# Patient Record
Sex: Male | Born: 1985 | Race: Black or African American | Hispanic: No | Marital: Single | Smoking: Current every day smoker
Health system: Southern US, Community
[De-identification: ages and names within clinical notes are randomized; demographics above are authoritative.]

## PROBLEM LIST (undated history)

## (undated) HISTORY — PX: TESTICLE SURGERY: SHX794

## (undated) HISTORY — PX: OTHER SURGICAL HISTORY: SHX169

---

## 2018-09-30 ENCOUNTER — Emergency Department (HOSPITAL_COMMUNITY): Payer: Self-pay

## 2018-09-30 ENCOUNTER — Emergency Department (HOSPITAL_COMMUNITY)
Admission: EM | Admit: 2018-09-30 | Discharge: 2018-10-01 | Disposition: A | Discharge status: Home / Self Care | Payer: Self-pay | Attending: Emergency Medicine | Admitting: Emergency Medicine

## 2018-09-30 ENCOUNTER — Encounter (HOSPITAL_COMMUNITY): Payer: Self-pay | Admitting: Emergency Medicine

## 2018-09-30 ENCOUNTER — Other Ambulatory Visit: Payer: Self-pay

## 2018-09-30 DIAGNOSIS — Y92838 Other recreation area as the place of occurrence of the external cause: Secondary | ICD-10-CM | POA: Insufficient documentation

## 2018-09-30 DIAGNOSIS — T148XXA Other injury of unspecified body region, initial encounter: Secondary | ICD-10-CM

## 2018-09-30 DIAGNOSIS — F17210 Nicotine dependence, cigarettes, uncomplicated: Secondary | ICD-10-CM | POA: Insufficient documentation

## 2018-09-30 DIAGNOSIS — Y9352 Activity, horseback riding: Secondary | ICD-10-CM | POA: Insufficient documentation

## 2018-09-30 DIAGNOSIS — T1490XA Injury, unspecified, initial encounter: Secondary | ICD-10-CM

## 2018-09-30 DIAGNOSIS — Y998 Other external cause status: Secondary | ICD-10-CM | POA: Insufficient documentation

## 2018-09-30 DIAGNOSIS — S8991XA Unspecified injury of right lower leg, initial encounter: Secondary | ICD-10-CM | POA: Insufficient documentation

## 2018-09-30 MED ORDER — OXYCODONE-ACETAMINOPHEN 5-325 MG PO TABS
1.0000 | ORAL_TABLET | Freq: Once | ORAL | Status: AC
  Administered 2018-09-30: 1 via ORAL
  Filled 2018-09-30: qty 1

## 2018-09-30 MED ORDER — KETOROLAC TROMETHAMINE 60 MG/2ML IM SOLN
30.0000 mg | Freq: Once | INTRAMUSCULAR | Status: AC
  Administered 2018-09-30: 30 mg via INTRAMUSCULAR
  Filled 2018-09-30: qty 2

## 2018-09-30 NOTE — ED Triage Notes (Signed)
Pt c/o right leg pain after getting dragged by horse. Pt has scraps on face and neck. Pt has some swelling to right knee. Pt ambulatory to triage.

## 2018-10-01 NOTE — ED Provider Notes (Signed)
[facility] EMERGENCY DEPARTMENT Provider Note   CSN: [id number] Arrival date & time: 09/30/18  2115    History   Chief Complaint Chief Complaint  Patient presents with  . Leg Injury    HPI Sean Zuniga is a 33 y.o. male.      Knee Pain Location:  Knee Injury: yes   Mechanism of injury comment:  Was riding a horse when he fell off twisting his right knee because his foot was stuck in the stirrup and hearing a pop Knee location:  R knee Pain details:    Quality:  Aching and sharp   Radiates to:  Does not radiate   Severity:  Moderate   Onset quality:  Sudden   Timing:  Constant Chronicity:  New Dislocation: no   Prior injury to area:  No Relieved by:  None tried Worsened by:  Nothing Ineffective treatments:  None tried   History reviewed. No pertinent past medical history.  There are no active problems to display for this patient.   Past Surgical History:  Procedure Laterality Date  . finger reattachment    . TESTICLE SURGERY          Home Medications    Prior to Admission medications   Not on File    Family History No family history on file.  Social History Social History   Tobacco Use  . Smoking status: Current Every Day Smoker    Packs/day: 1.50  . Smokeless tobacco: Former Network engineer Use Topics  . Alcohol use: Never    Frequency: Never  . Drug use: Never     Allergies   Patient has no allergy information on record.   Review of Systems Review of Systems  Skin: Positive for wound (below his right eye and in middle lower back).  All other systems reviewed and are negative.    Physical Exam Updated Vital Signs BP 118/82   Pulse 86   Temp 98.5 F (36.9 C)   Resp 16   Ht 6' (1.829 m)   Wt 73.9 kg   SpO2 100%   BMI 22.11 kg/m   Physical Exam Vitals signs and nursing note reviewed.  Constitutional:      Appearance: He is well-developed.  HENT:     Head: Normocephalic and atraumatic.     Mouth/Throat:   Mouth: Mucous membranes are dry.     Pharynx: Oropharynx is clear.  Eyes:     Conjunctiva/sclera: Conjunctivae normal.  Neck:     Musculoskeletal: Normal range of motion.  Cardiovascular:     Rate and Rhythm: Normal rate.  Pulmonary:     Effort: Pulmonary effort is normal. No respiratory distress.  Abdominal:     General: There is no distension.  Musculoskeletal: Normal range of motion.        General: Swelling (mild rightk nee) and tenderness (with palpation of knee and with ROM. would not allow full drawer and valgus/varus stress testing 2/2 pain) present.  Skin:    General: Skin is warm and dry.     Comments: Abrasion under right eye and on left lower back near spine without laceration  Neurological:     General: No focal deficit present.     Mental Status: He is alert.      ED Treatments / Results  Labs (all labs ordered are listed, but only abnormal results are displayed) Labs Reviewed - No data to display  EKG None  Radiology Dg Tibia/fibula Right  Result Date: 09/30/2018  CLINICAL DATA:  Right lower leg pain following being dragged by horse, initial encounter EXAM: RIGHT TIBIA AND FIBULA - 2 VIEW COMPARISON:  None. FINDINGS: There is no evidence of fracture or other focal bone lesions. Soft tissues are unremarkable. IMPRESSION: No acute abnormality noted. Electronically Signed   By: Alcide CleverMark  Lukens M.D.   On: 09/30/2018 22:47   Dg Knee Complete 4 Views Right  Result Date: 09/30/2018 CLINICAL DATA:  Right knee pain following being drug by horse, initial encounter EXAM: RIGHT KNEE - COMPLETE 4+ VIEW COMPARISON:  None. FINDINGS: No evidence of fracture, dislocation, or joint effusion. No evidence of arthropathy or other focal bone abnormality. Soft tissues are unremarkable. IMPRESSION: No acute abnormality noted. Electronically Signed   By: Alcide CleverMark  Lukens M.D.   On: 09/30/2018 22:48   Dg Femur, Min 2 Views Right  Result Date: 09/30/2018 CLINICAL DATA:  Right hip pain following  being drug by horse, initial encounter EXAM: RIGHT FEMUR 2 VIEWS COMPARISON:  None. FINDINGS: No acute fracture or dislocation is noted. Benign fibrous cortical defect is noted within the distal femur. No soft tissue abnormality is noted. IMPRESSION: No acute abnormality seen. Electronically Signed   By: Alcide CleverMark  Lukens M.D.   On: 09/30/2018 22:48    Procedures Procedures (including critical care time)  Medications Ordered in ED Medications  oxyCODONE-acetaminophen (PERCOCET/ROXICET) 5-325 MG per tablet 1 tablet (1 tablet Oral Given 09/30/18 2348)  ketorolac (TORADOL) injection 30 mg (30 mg Intramuscular Given 09/30/18 2348)     Initial Impression / Assessment and Plan / ED Course  I have reviewed the triage vital signs and the nursing notes.  Pertinent labs & imaging results that were available during my care of the patient were reviewed by me and considered in my medical decision making (see chart for details).        Likely ligamentous injury of his right knee.  Knee immobilizer given.  Instructed to follow with orthopedics near his home.  If amatory is in the meantime.  Final Clinical Impressions(s) / ED Diagnoses   Final diagnoses:  Injury of right knee, initial encounter  Ligament tear    ED Discharge Orders    None       Jadarian Mckay, Barbara CowerJason, MD 10/01/18 [phone]

## 2020-04-26 IMAGING — DX RIGHT TIBIA AND FIBULA - 2 VIEW
4 series · 4 of 4 positions shown · non-contrast
Comparison: None.

CLINICAL DATA: Right lower leg pain following being dragged by
horse, initial encounter

EXAM:
RIGHT TIBIA AND FIBULA - 2 VIEW

[tibia ap (1 of 2)]
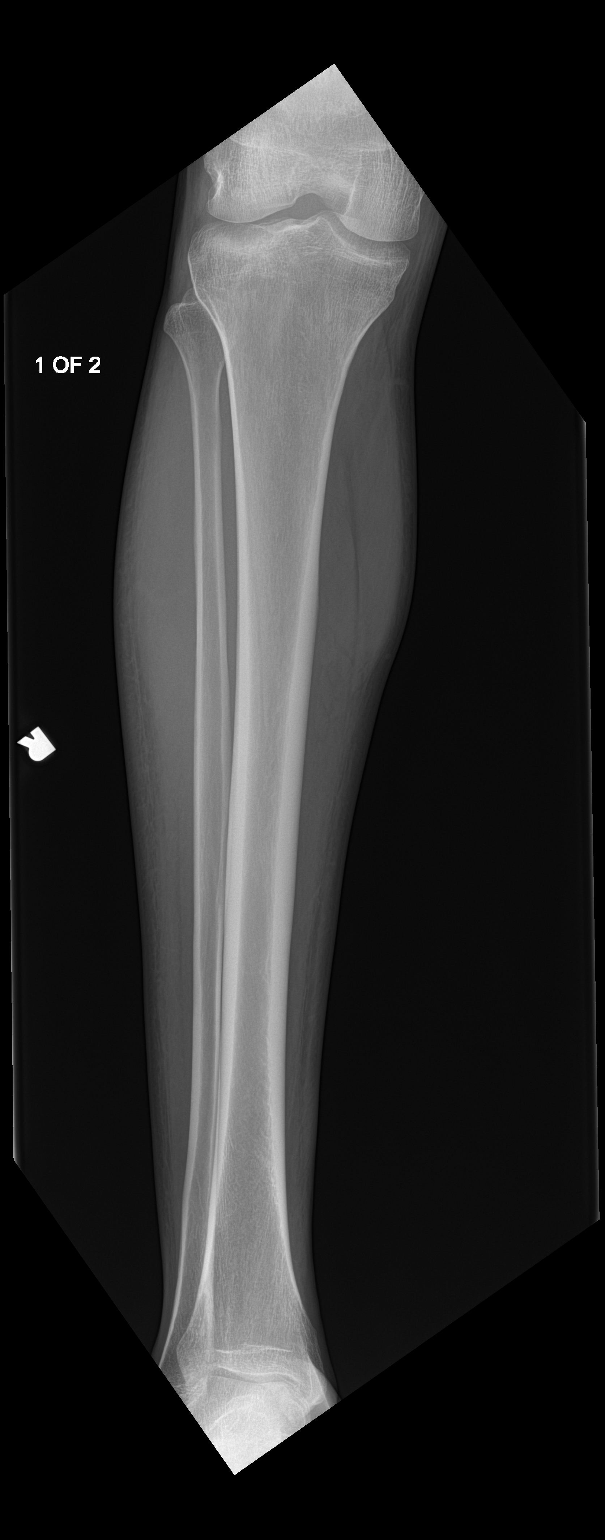

[tibia ap (2 of 2)]
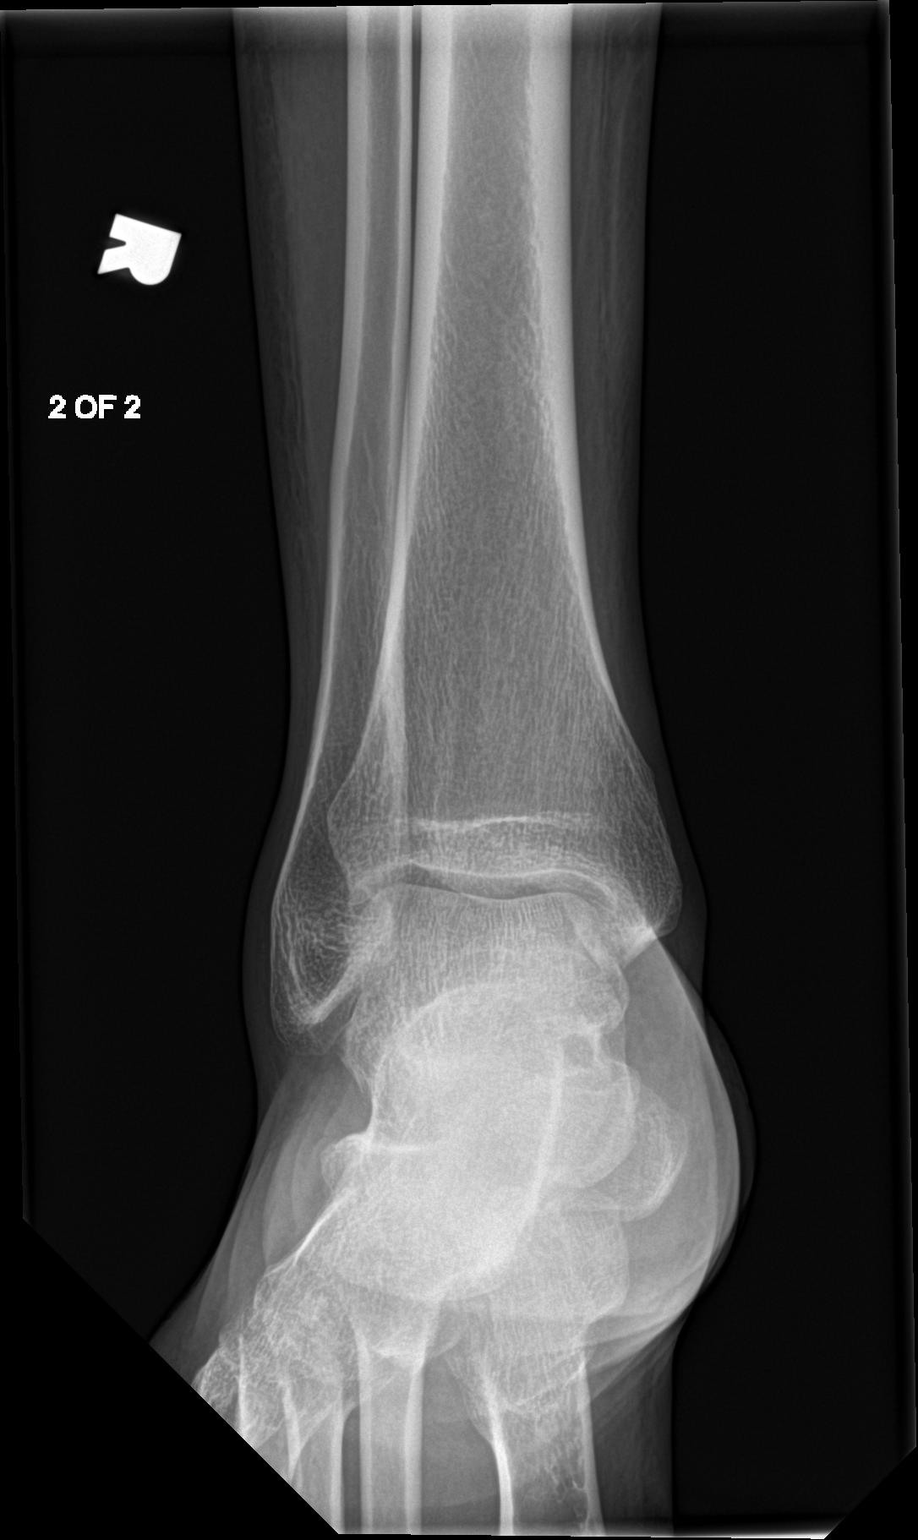

[tibia lat (1 of 2)]
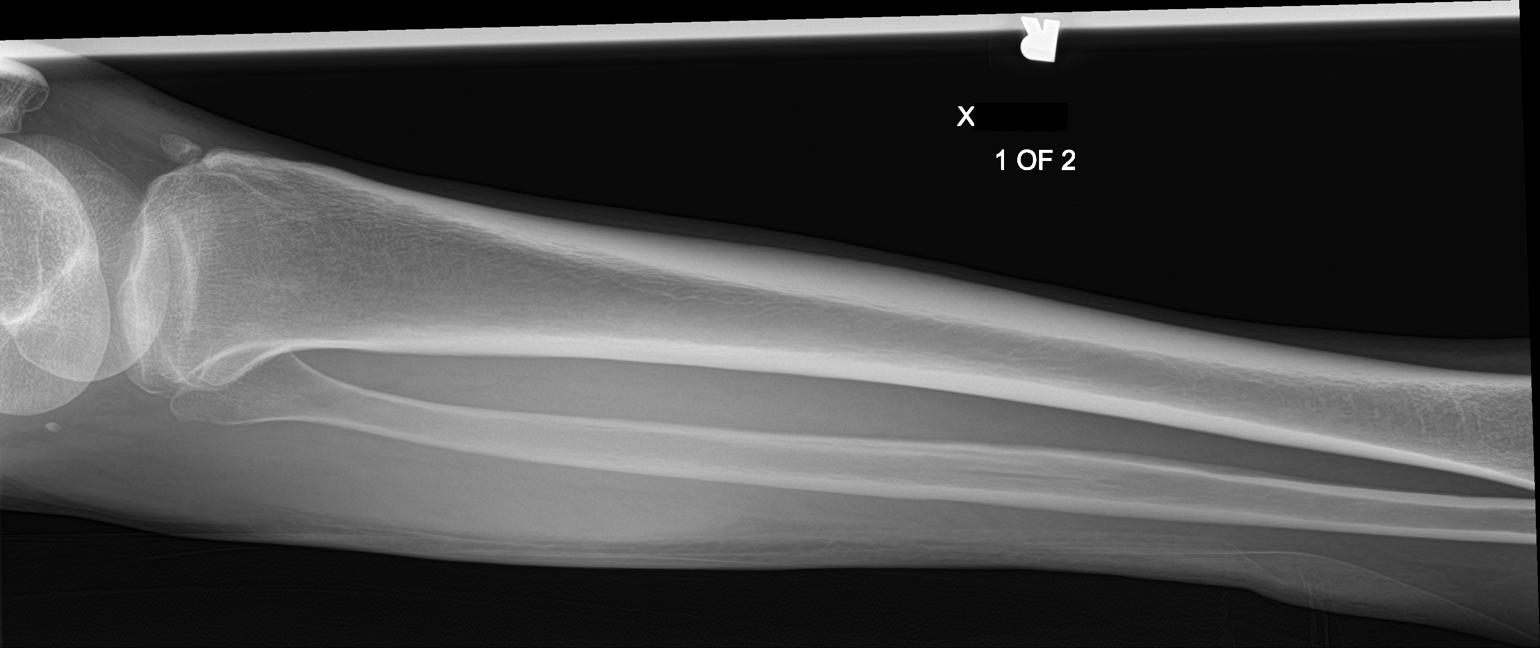

[tibia lat (2 of 2)]
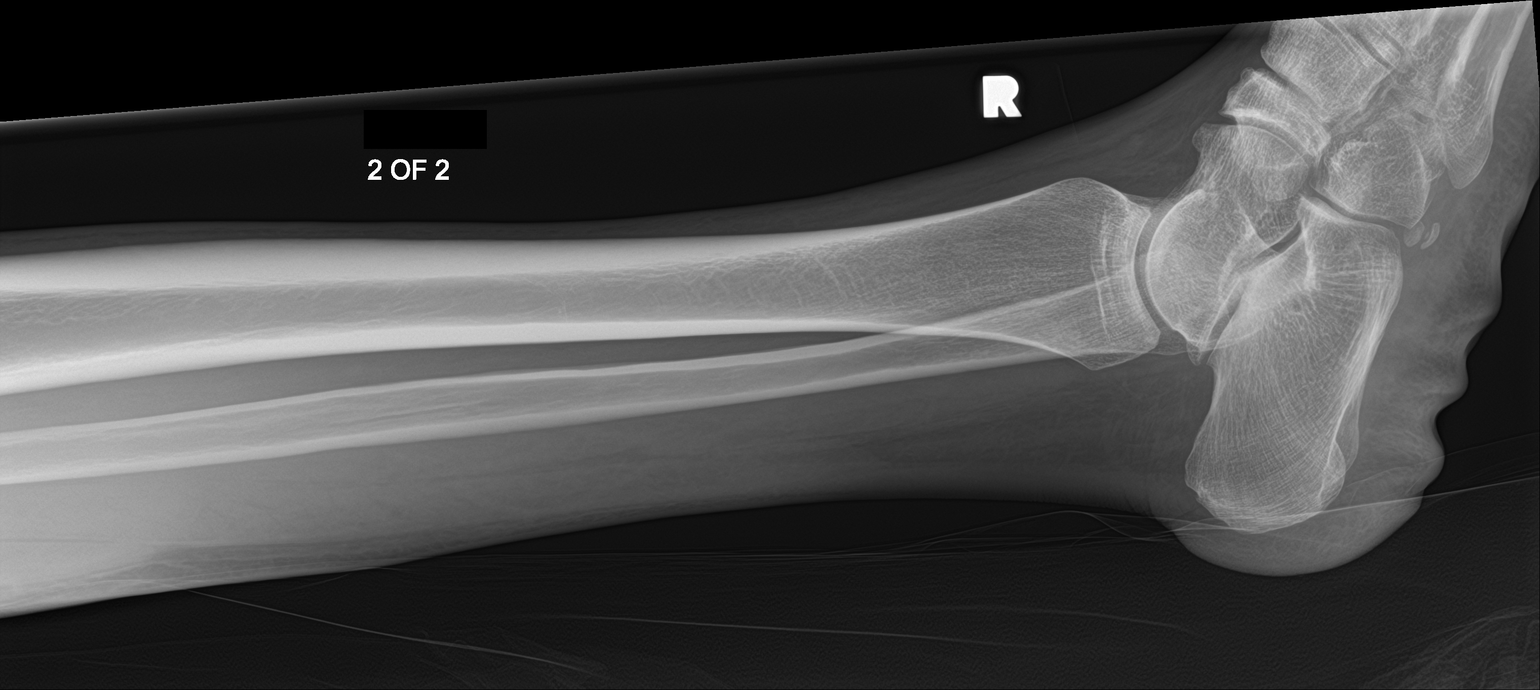

[4 of 4 positions shown; findings below may reference images not displayed]

FINDINGS: There is no evidence of fracture or other focal bone lesions. Soft
tissues are unremarkable.
IMPRESSION: No acute abnormality noted.
